# Patient Record
Sex: Male | Born: 1990
Health system: Southern US, Community
[De-identification: ages and names within clinical notes are randomized; demographics above are authoritative.]

## PROBLEM LIST (undated history)

## (undated) DIAGNOSIS — K769 Liver disease, unspecified: Secondary | ICD-10-CM

---

## 2017-05-03 ENCOUNTER — Emergency Department (HOSPITAL_BASED_OUTPATIENT_CLINIC_OR_DEPARTMENT_OTHER)
Admission: EM | Admit: 2017-05-03 | Discharge: 2017-05-03 | Disposition: A | Discharge status: Home / Self Care | Payer: BLUE CROSS/BLUE SHIELD | Attending: Emergency Medicine | Admitting: Emergency Medicine

## 2017-05-03 ENCOUNTER — Encounter (HOSPITAL_BASED_OUTPATIENT_CLINIC_OR_DEPARTMENT_OTHER): Payer: Self-pay | Admitting: *Deleted

## 2017-05-03 ENCOUNTER — Emergency Department (HOSPITAL_BASED_OUTPATIENT_CLINIC_OR_DEPARTMENT_OTHER): Payer: BLUE CROSS/BLUE SHIELD

## 2017-05-03 DIAGNOSIS — M549 Dorsalgia, unspecified: Secondary | ICD-10-CM | POA: Diagnosis not present

## 2017-05-03 DIAGNOSIS — N3090 Cystitis, unspecified without hematuria: Secondary | ICD-10-CM

## 2017-05-03 DIAGNOSIS — N3000 Acute cystitis without hematuria: Secondary | ICD-10-CM | POA: Insufficient documentation

## 2017-05-03 DIAGNOSIS — F17200 Nicotine dependence, unspecified, uncomplicated: Secondary | ICD-10-CM | POA: Insufficient documentation

## 2017-05-03 DIAGNOSIS — R300 Dysuria: Secondary | ICD-10-CM | POA: Diagnosis present

## 2017-05-03 HISTORY — DX: Liver disease, unspecified: K76.9

## 2017-05-03 LAB — URINALYSIS, ROUTINE W REFLEX MICROSCOPIC
Bilirubin Urine: NEGATIVE
Glucose, UA: NEGATIVE mg/dL
Hgb urine dipstick: NEGATIVE
KETONES UR: NEGATIVE mg/dL
LEUKOCYTES UA: NEGATIVE
NITRITE: NEGATIVE
PH: 8 (ref 5.0–8.0)
PROTEIN: NEGATIVE mg/dL
Specific Gravity, Urine: 1.015 (ref 1.005–1.030)

## 2017-05-03 MED ORDER — PREDNISONE 10 MG PO TABS: 40.0000 mg | ORAL_TABLET | Freq: Every day | ORAL | 0 refills | Status: AC

## 2017-05-03 MED FILL — predniSONE 10 MG TABS: 10 | 5 days supply | Qty: 20 | Fill #0

## 2017-05-03 NOTE — Discharge Instructions (Signed)
Your CT scan did show slight thickening of your wall of the bladder. Take prednisone as prescribed until all gone for inflammation. Follow-up with urology for further treatment.

## 2017-05-03 NOTE — ED Provider Notes (Signed)
[facility]   CSN: [id number] Arrival date & time: 05/03/17  1406     History   Chief Complaint Chief Complaint  Patient presents with  . Dysuria    HPI Steve Ware is a 26 y.o. male.  HPI Steve Ware is a 26 y.o. male presents to emergency department complaining of lower back pain and dysuria. Patient states symptoms have been going on for several weeks. He has been seen by several different providers, had a CT renal done which showed no evidence of kidney stones, had urinalysis and urine culture performed which was negative, had gonorrhea and chlamydia cultures done which were negative as well. Patient states that he is having a dull pain to bilateral lower back, that radiates around bilateral groin, and is worsened with urination. He states when he urinates he has pain in his penis. He states it feels like "peeing a razor blades." He denies any skin lesions over her perineum or penis or scrotum. He denies any fever or chills. No nausea or vomiting. No abdominal pain. He states he had to leave work early today because he was in so much pain. He reports back pain is also worsened with any movement.  Past Medical History:  Diagnosis Date  . Liver damage     There are no active problems to display for this patient.   History reviewed. No pertinent surgical history.     Home Medications    Prior to Admission medications   Not on File    Family History No family history on file.  Social History Social History  Substance Use Topics  . Smoking status: Current Every Day Smoker  . Smokeless tobacco: Never Used  . Alcohol use No     Allergies   Acetaminophen   Review of Systems Review of Systems  Constitutional: Negative for chills and fever.  Respiratory: Negative for cough, chest tightness and shortness of breath.   Cardiovascular: Negative for chest pain, palpitations and leg swelling.  Gastrointestinal: Negative for abdominal  distention, abdominal pain, diarrhea, nausea and vomiting.  Genitourinary: Positive for dysuria, frequency and penile pain. Negative for discharge, hematuria, penile swelling, scrotal swelling, testicular pain and urgency.  Musculoskeletal: Positive for back pain. Negative for arthralgias, myalgias, neck pain and neck stiffness.  Skin: Negative for rash.  Allergic/Immunologic: Negative for immunocompromised state.  Neurological: Negative for dizziness, weakness, light-headedness, numbness and headaches.  All other systems reviewed and are negative.    Physical Exam Updated Vital Signs BP 132/86   Pulse 89   Temp 98.3 F (36.8 C) (Oral)   Resp 20   Ht 6' (1.829 m)   Wt 61.2 kg (135 lb)   SpO2 99%   BMI 18.31 kg/m   Physical Exam  Constitutional: He appears well-developed and well-nourished. No distress.  Eyes: Conjunctivae are normal.  Neck: Neck supple.  Cardiovascular: Normal rate.   Pulmonary/Chest: No respiratory distress.  Abdominal: He exhibits no distension.  Skin: Skin is warm and dry.  Nursing note and vitals reviewed.    ED Treatments / Results  Labs (all labs ordered are listed, but only abnormal results are displayed) Labs Reviewed  URINALYSIS, ROUTINE W REFLEX MICROSCOPIC    EKG  EKG Interpretation None       Radiology Ct Renal Stone Study  Result Date: 05/03/2017 CLINICAL DATA:  Abdominal pain with pain and dysuria EXAM: CT ABDOMEN AND PELVIS WITHOUT CONTRAST TECHNIQUE: Multidetector CT imaging of the abdomen and pelvis was performed following the  standard protocol without oral or intravenous contrast material administration. COMPARISON:  None. FINDINGS: Lower chest: Lung bases are clear. Hepatobiliary: No focal liver lesions are apparent on this noncontrast enhanced study. Gallbladder wall is not appreciably thickened. There is no biliary duct dilatation. Pancreas: There is no pancreatic mass or inflammatory focus. Spleen: No splenic lesions are  evident. Adrenals/Urinary Tract: Adrenals appear normal bilaterally. Kidneys bilaterally show no evident mass or hydronephrosis on either side. There is no appreciable renal or ureteral calculus on either side. Urinary bladder is midline with wall thickness overall mildly increased. Stomach/Bowel: Rectum is mildly distended with stool. There are scattered sigmoid diverticula without diverticulitis. There is no appreciable bowel wall or mesenteric thickening. There is no appreciable bowel obstruction. No free air or portal venous air. Vascular/Lymphatic: No abdominal aortic aneurysm. No vascular lesions are evident. There is a single prominent lymph node to the left of the aorta slightly superior to the left kidney measuring 1.3 x 1.1 cm. No lymph node prominence elsewhere is seen in the abdomen or pelvis. Reproductive: Prostate and seminal vesicles are normal in size and contour. There is no evident pelvic mass. Other: Appendix appears unremarkable. There is no abscess or ascites in the abdomen or pelvis. Musculoskeletal: There are no blastic or lytic bone lesions. There is no intramuscular or abdominal wall lesion. IMPRESSION: 1. Slight thickening in the urinary bladder wall. Question a degree of cystitis. 2.  No renal or ureteral calculus.  No hydronephrosis. 3. Scattered sigmoid diverticula without diverticulitis. No bowel obstruction. No abscess. Appendix appears normal. 4. Single mildly prominent left retroperitoneal lymph node measuring 1.3 x 1.1 cm to the left of the aorta slightly superior left kidney. No other lymph node prominence evident. Electronically Signed   By: Bretta Bang III M.D.   On: 05/03/2017 15:14    Procedures Procedures (including critical care time)  Medications Ordered in ED Medications - No data to display   Initial Impression / Assessment and Plan / ED Course  I have reviewed the triage vital signs and the nursing notes.  Pertinent labs & imaging results that were  available during my care of the patient were reviewed by me and considered in my medical decision making (see chart for details).     Patient in emergency department with lower back pain that radiates into bilateral groin. States pain is worse with urinating, also reporting pain in his penis when urinating. Denies any skin lesions. No history of herpes infection. Has had recent UA with negative cultures, also negative GC/chlamydia. Will get ct renal for evaluation to ro kidney stones.   3:34 PM  CT is negative for kidney stones, however is showing mild wall thickening. Will refer to urology for further evaluation. Will treat with steroids for possible radicular pain. Pt afebrile, nontoxic-appearing, normal myocytes. He stable for discharge home.  Vitals:   05/03/17 1409 05/03/17 1414  BP: 132/86 132/86  Pulse: (!) 102 89  Resp: 20 20  Temp: 98.3 F (36.8 C) 98.3 F (36.8 C)  TempSrc: Oral Oral  SpO2: 100% 99%  Weight: 61.2 kg (135 lb)   Height: 6' (1.829 m)       Final Clinical Impressions(s) / ED Diagnoses   Final diagnoses:  Cystitis  Back pain, unspecified back location, unspecified back pain laterality, unspecified chronicity    New Prescriptions New Prescriptions   PREDNISONE (DELTASONE) 10 MG TABLET    Take 4 tablets (40 mg total) by mouth daily.     Jaynie Crumble, PA-C  05/03/17 1626    Charlynne PanderYao, David Hsienta, MD 05/04/17 [phone]

## 2017-05-03 NOTE — ED Triage Notes (Signed)
Burning with urination. He was given an antibiotic a week for possible UTI. The office called him today and told him his urine culture was negative for UTI and he could stop the antibiotic. He has also been taking AZO for pain.

## 2017-09-02 ENCOUNTER — Emergency Department (HOSPITAL_BASED_OUTPATIENT_CLINIC_OR_DEPARTMENT_OTHER)
Admission: EM | Admit: 2017-09-02 | Discharge: 2017-09-02 | Disposition: A | Discharge status: Home / Self Care | Payer: BLUE CROSS/BLUE SHIELD | Attending: Emergency Medicine | Admitting: Emergency Medicine

## 2017-09-02 ENCOUNTER — Other Ambulatory Visit: Payer: Self-pay

## 2017-09-02 DIAGNOSIS — T401X1A Poisoning by heroin, accidental (unintentional), initial encounter: Secondary | ICD-10-CM | POA: Insufficient documentation

## 2017-09-02 DIAGNOSIS — Z79899 Other long term (current) drug therapy: Secondary | ICD-10-CM | POA: Diagnosis not present

## 2017-09-02 DIAGNOSIS — F17200 Nicotine dependence, unspecified, uncomplicated: Secondary | ICD-10-CM | POA: Diagnosis not present

## 2017-09-02 NOTE — ED Provider Notes (Signed)
[facility] EMERGENCY DEPARTMENT Provider Note   CSN: [id number] Arrival date & time: 09/02/17  0314     History   Chief Complaint Chief Complaint  Patient presents with  . Drug Overdose    HPI Steve Ware is a 27 y.o. male.  HPI  This is a 27 year old male who presents after heroin overdose.  Patient reports that he has a history of heroin abuse; however, he has recently been clean.  Tonight he relapsed and snorted some heroin from his neighbor.  He denies any other alcohol or drug use.  He was found by his wife with agonal respirations.  EMS administered 2 mg of Narcan.  Patient's mental status and respiratory status improved.  Currently he is asymptomatic.  He has no acute complaints.  He states "I did not do that much."  However, patient states that he has not used heroin in some time.  Past Medical History:  Diagnosis Date  . Liver damage     There are no active problems to display for this patient.   No past surgical history on file.     Home Medications    Prior to Admission medications   Medication Sig Start Date End Date Taking? Authorizing Provider  predniSONE (DELTASONE) 10 MG tablet Take 4 tablets (40 mg total) by mouth daily. 05/03/17   Jaynie CrumbleKirichenko, Tatyana, PA-C    Family History No family history on file.  Social History Social History   Tobacco Use  . Smoking status: Current Every Day Smoker  . Smokeless tobacco: Never Used  Substance Use Topics  . Alcohol use: No  . Drug use: No     Allergies   Acetaminophen   Review of Systems Review of Systems  Constitutional: Negative for fever.  Respiratory: Negative for shortness of breath.   Cardiovascular: Negative for chest pain.  Gastrointestinal: Negative for abdominal pain, nausea and vomiting.  Psychiatric/Behavioral: Negative for confusion.  All other systems reviewed and are negative.    Physical Exam Updated Vital Signs BP 118/71   Pulse (!) 104   Resp 17   Wt 61.2 kg  (135 lb)   SpO2 95%   BMI 18.31 kg/m   Physical Exam  Constitutional: He is oriented to person, place, and time. No distress.  Disheveled appearing, no acute distress, smells of smoke  HENT:  Head: Normocephalic and atraumatic.  Eyes: Pupils are equal, round, and reactive to light.  Pupils 3 mm reactive bilaterally  Neck:  Nasal septum intact  Cardiovascular: Normal rate, regular rhythm and normal heart sounds.  No murmur heard. Pulmonary/Chest: Effort normal and breath sounds normal. No respiratory distress. He has no wheezes.  Abdominal: Soft. Bowel sounds are normal. There is no tenderness. There is no rebound.  Musculoskeletal: He exhibits no edema.  Neurological: He is alert and oriented to person, place, and time.  Skin: Skin is warm and dry.  No significant track Schadt noted  Psychiatric: He has a normal mood and affect.  Nursing note and vitals reviewed.    ED Treatments / Results  Labs (all labs ordered are listed, but only abnormal results are displayed) Labs Reviewed - No data to display  EKG  EKG Interpretation None       Radiology No results found.  Procedures Procedures (including critical care time)  Medications Ordered in ED Medications - No data to display   Initial Impression / Assessment and Plan / ED Course  I have reviewed the triage vital signs and the nursing  notes.  Pertinent labs & imaging results that were available during my care of the patient were reviewed by me and considered in my medical decision making (see chart for details).     Patient presents after reported here with an overdose.  He is currently without complaint and vital signs are stable.  He reports that this was accidental and a relapse.  Denies any other drug use.  He is clinically stable at this time.  We will continue to monitor.  No indication at this time for additional Narcan.  No additional workup.  5:47 AM Patient's wife at the bedside.  Patient and wife  educated regarding the dangers of drug use and abuse.  Patient states understanding.  Requesting resources.  These will be provided.  After history, exam, and medical workup I feel the patient has been appropriately medically screened and is safe for discharge home. Pertinent diagnoses were discussed with the patient. Patient was given return precautions.   Final Clinical Impressions(s) / ED Diagnoses   Final diagnoses:  Accidental overdose of heroin, initial encounter [facility])    ED Discharge Orders    None       Shon Baton, MD 09/02/17 [phone]

## 2017-09-02 NOTE — ED Triage Notes (Signed)
[facility]. Pt snorted heroin, had agonal respirations at home. Per EMS pt was given 2 mg of Narcan, bagged and came to. Pt alert and oriented on triage.

## 2017-09-02 NOTE — Discharge Instructions (Signed)
You were seen today after a heroin overdose.  This can be life-threatening.  You will be provided resources.

## 2018-09-10 IMAGING — CT CT RENAL STONE PROTOCOL
2 of 4 series · 16 of 46 positions shown, 18 images · non-contrast
Comparison: None.

CLINICAL DATA: Abdominal pain with pain and dysuria

EXAM:
CT ABDOMEN AND PELVIS WITHOUT CONTRAST
TECHNIQUE: Multidetector CT imaging of the abdomen and pelvis was performed
following the standard protocol without oral or intravenous contrast
material administration.

[Series 2: axial st · axial · 0.70mm/px · z∈[-524,-114]mm · 13 of 90 slices shown, 15 images]
[im 4/90  soft-tissue]
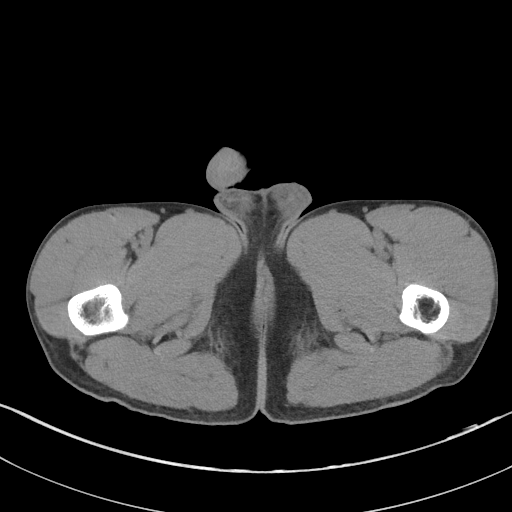
[im 4/90  bone]
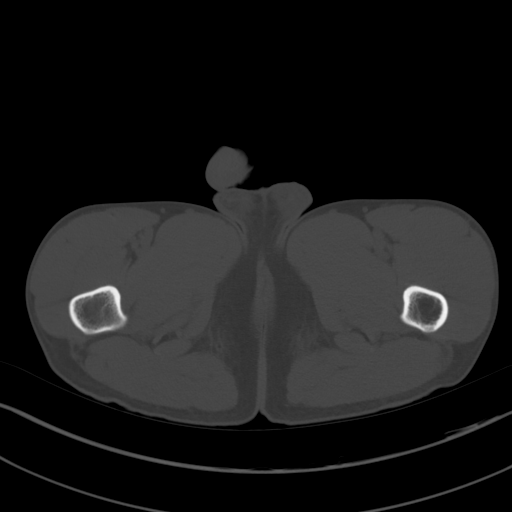
[im 11/90  soft-tissue]
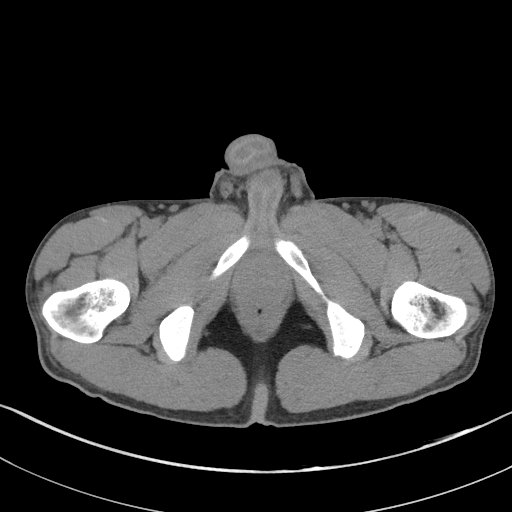
[im 18/90  soft-tissue]
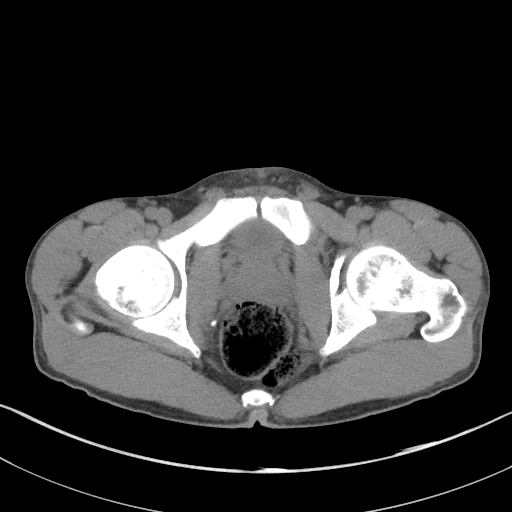
[im 24/90  soft-tissue]
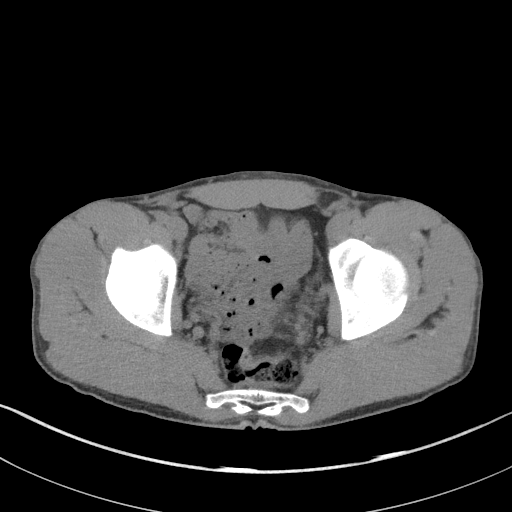
[im 31/90  soft-tissue]
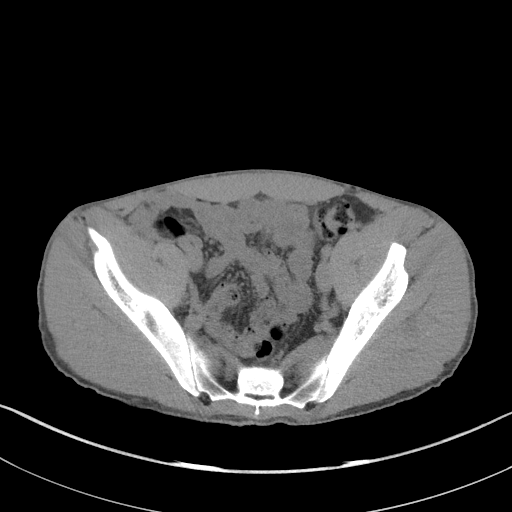
[im 38/90  soft-tissue]
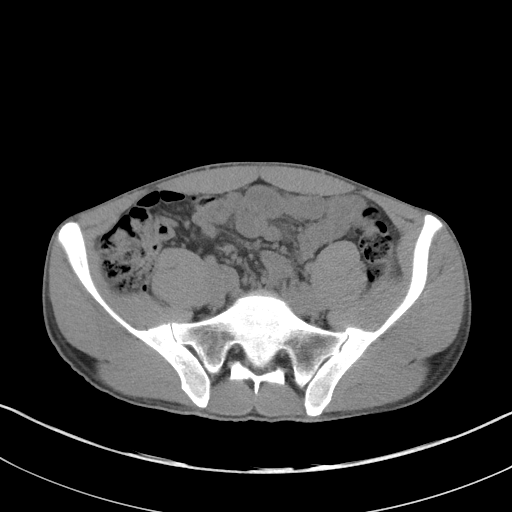
[im 45/90  soft-tissue]
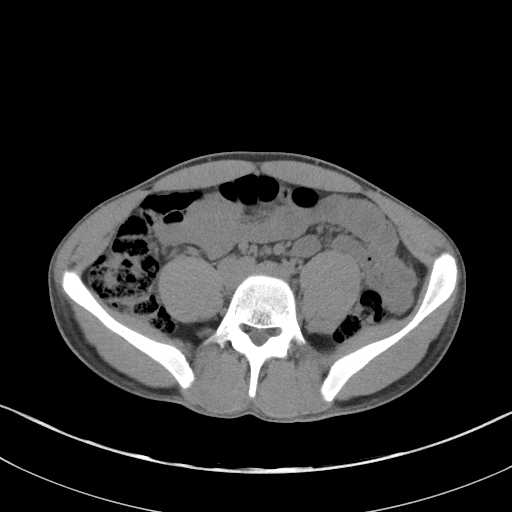
[im 52/90  soft-tissue]
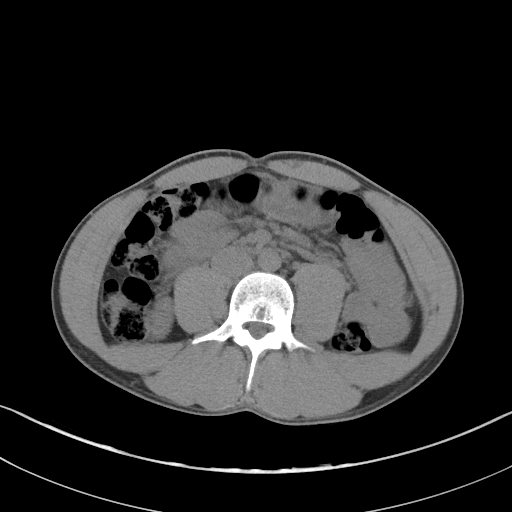
[im 59/90  soft-tissue]
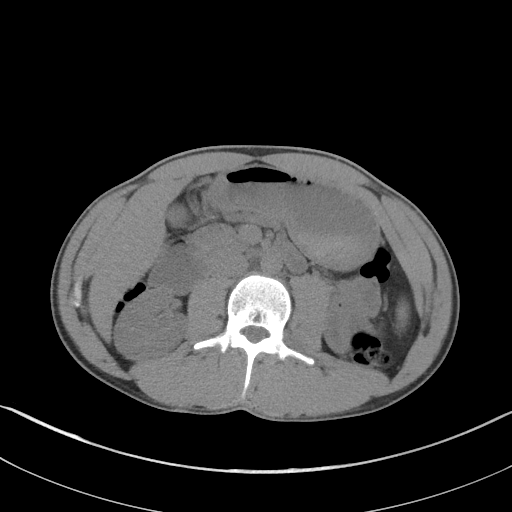
[im 59/90  bone]
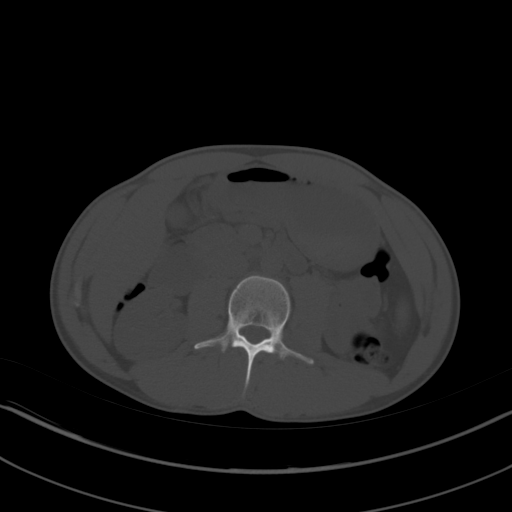
[im 66/90  soft-tissue]
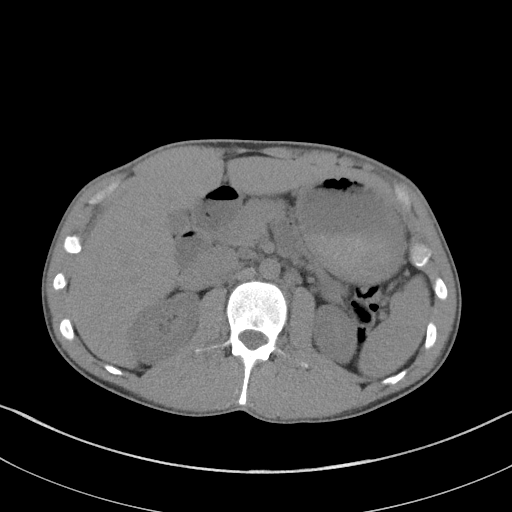
[im 72/90  soft-tissue]
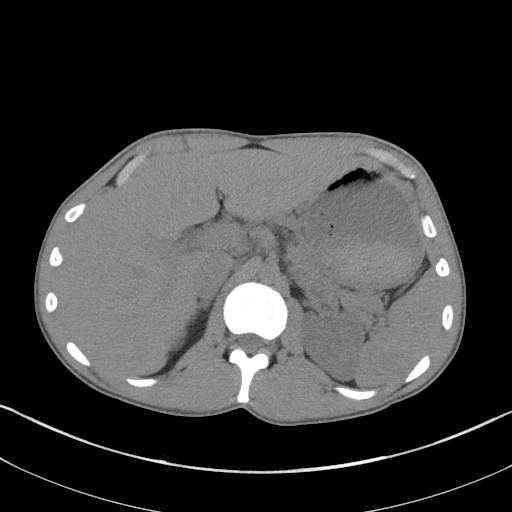
[im 79/90  soft-tissue]
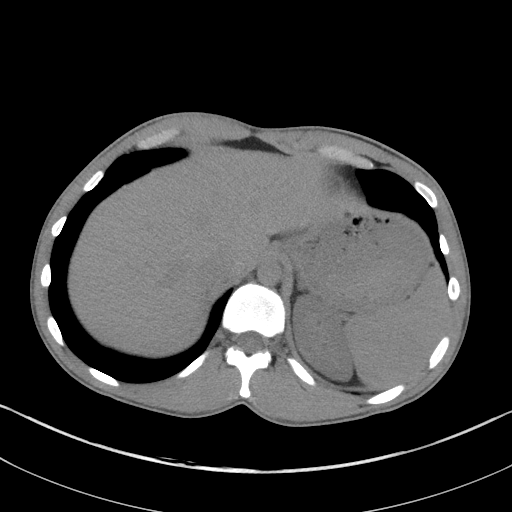
[im 86/90  soft-tissue]
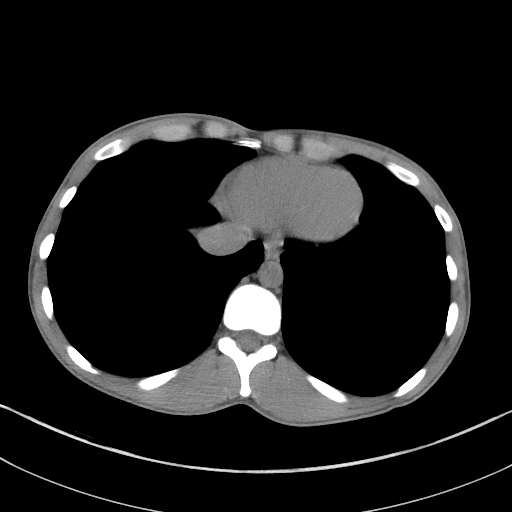

[Series 5: coronal st · coronal · 0.75mm/px · 3 of 70 slices shown]
[im 24/70  soft-tissue]
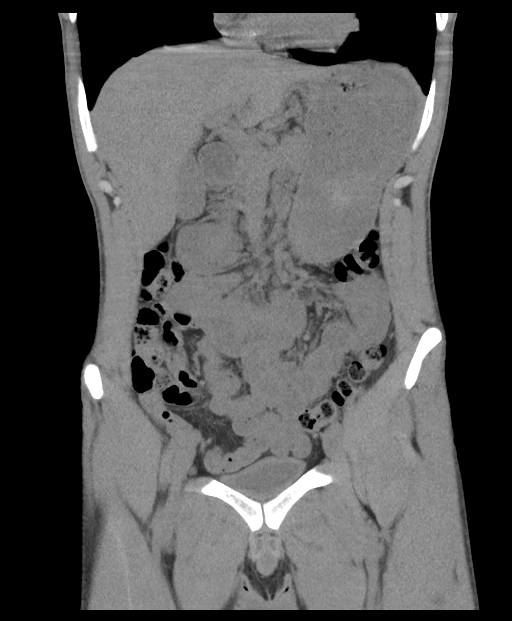
[im 31/70  soft-tissue]
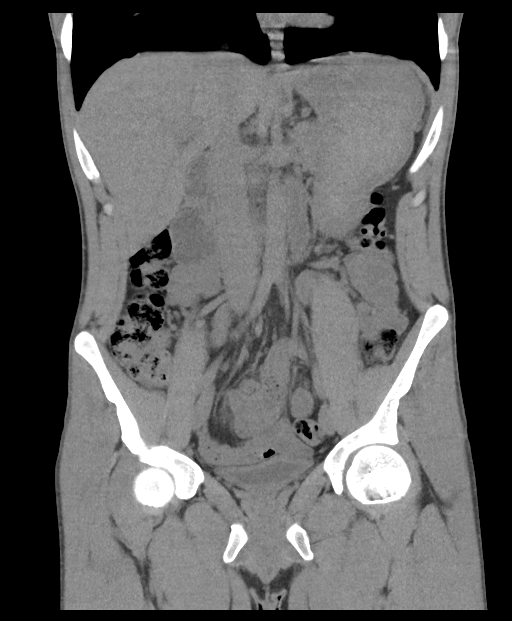
[im 39/70  soft-tissue]
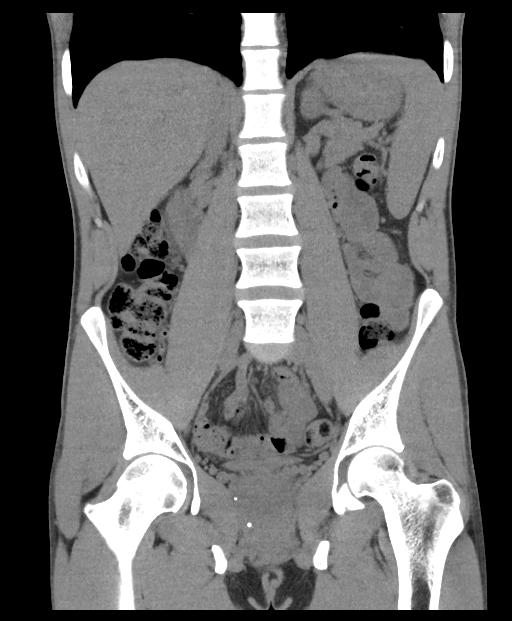

[16 of 46 positions shown; findings below may reference images not displayed]

FINDINGS: Lower chest: Lung bases are clear.

Hepatobiliary: No focal liver lesions are apparent on this
noncontrast enhanced study. Gallbladder wall is not appreciably
thickened. There is no biliary duct dilatation.

Pancreas: There is no pancreatic mass or inflammatory focus.

Spleen: No splenic lesions are evident.

Adrenals/Urinary Tract: Adrenals appear normal bilaterally. Kidneys
bilaterally show no evident mass or hydronephrosis on either side.
There is no appreciable renal or ureteral calculus on either side.
Urinary bladder is midline with wall thickness overall mildly
increased.

Stomach/Bowel: Rectum is mildly distended with stool. There are
scattered sigmoid diverticula without diverticulitis. There is no
appreciable bowel wall or mesenteric thickening. There is no
appreciable bowel obstruction. No free air or portal venous air.

Vascular/Lymphatic: No abdominal aortic aneurysm. No vascular
lesions are evident. There is a single prominent lymph node to the
left of the aorta slightly superior to the left kidney measuring
x 1.1 cm. No lymph node prominence elsewhere is seen in the abdomen
or pelvis.

Reproductive: Prostate and seminal vesicles are normal in size and
contour. There is no evident pelvic mass.

Other: Appendix appears unremarkable. There is no abscess or ascites
in the abdomen or pelvis.

Musculoskeletal: There are no blastic or lytic bone lesions. There
is no intramuscular or abdominal wall lesion.
IMPRESSION: 1. Slight thickening in the urinary bladder wall. Question a degree
of cystitis.

2.  No renal or ureteral calculus.  No hydronephrosis.

3. Scattered sigmoid diverticula without diverticulitis. No bowel
obstruction. No abscess. Appendix appears normal.

4. Single mildly prominent left retroperitoneal lymph node measuring
1.3 x 1.1 cm to the left of the aorta slightly superior left kidney.
No other lymph node prominence evident.
